# Patient Record
Sex: Male | Born: 1995
Health system: Southern US, Community
[De-identification: ages and names within clinical notes are randomized; demographics above are authoritative.]

## PROBLEM LIST (undated history)

## (undated) DIAGNOSIS — IMO0001 Reserved for inherently not codable concepts without codable children: Secondary | ICD-10-CM

## (undated) DIAGNOSIS — K219 Gastro-esophageal reflux disease without esophagitis: Secondary | ICD-10-CM

## (undated) DIAGNOSIS — J45909 Unspecified asthma, uncomplicated: Secondary | ICD-10-CM

## (undated) DIAGNOSIS — L209 Atopic dermatitis, unspecified: Secondary | ICD-10-CM

## (undated) HISTORY — DX: Reserved for inherently not codable concepts without codable children: IMO0001

## (undated) HISTORY — DX: Atopic dermatitis, unspecified: L20.9

## (undated) HISTORY — PX: WISDOM TOOTH EXTRACTION: SHX21

## (undated) HISTORY — DX: Gastro-esophageal reflux disease without esophagitis: K21.9

## (undated) HISTORY — DX: Unspecified asthma, uncomplicated: J45.909

---
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
## 2016-01-19 ENCOUNTER — Encounter: Payer: Self-pay | Admitting: Allergy and Immunology

## 2016-01-19 ENCOUNTER — Ambulatory Visit (INDEPENDENT_AMBULATORY_CARE_PROVIDER_SITE_OTHER): Payer: BLUE CROSS/BLUE SHIELD | Admitting: Allergy and Immunology

## 2016-01-19 VITALS — BP 116/76 | HR 80 | Resp 18 | Ht 65.0 in | Wt 141.0 lb

## 2016-01-19 DIAGNOSIS — Z8709 Personal history of other diseases of the respiratory system: Secondary | ICD-10-CM

## 2016-01-19 MED ORDER — ALBUTEROL SULFATE HFA 108 (90 BASE) MCG/ACT IN AERS: 2.0000 | INHALATION_SPRAY | Freq: Four times a day (QID) | RESPIRATORY_TRACT | 1 refills | Status: DC | PRN

## 2016-01-19 MED ORDER — MOMETASONE FUROATE 110 MCG/INH IN AEPB: 1.0000 | INHALATION_SPRAY | Freq: Every day | RESPIRATORY_TRACT | 1 refills | Status: DC

## 2016-01-19 NOTE — Progress Notes (Signed)
Follow-up Note  Referring Provider: No ref. provider found Primary Provider: Guadalupe Maple, MD Date of Office Visit: 01/19/2016  Subjective:   Bobby Kim (DOB: Aug 23, 1995) is a 20 y.o. male who [facility] in re-evaluation of the following:  HPI: Rontrell presents this clinic in evaluation of his history of asthma and allergic rhinitis and atopic dermatitis. I've not seen him in his clinic since December 2014  He has resolved all of his atopic symptoms. He has no problems with his chest or nose or skin. He does not use any controller agents. He does not use any rescue medications. He can exercise without any difficulty. It is been over a year since she's used a short-acting bronchodilator    Medication List    none      Past Medical History:  Diagnosis Date  . Asthma   . Atopic dermatitis   . Reflux     Past Surgical History:  Procedure Laterality Date  . WISDOM TOOTH EXTRACTION      No Known Allergies  Review of systems negative except as noted in HPI / PMHx or noted below:  Review of Systems  Constitutional: Negative.   HENT: Negative.   Eyes: Negative.   Respiratory: Negative.   Cardiovascular: Negative.   Gastrointestinal: Negative.   Genitourinary: Negative.   Musculoskeletal: Negative.   Skin: Negative.   Neurological: Negative.   Endo/Heme/Allergies: Negative.   Psychiatric/Behavioral: Negative.      Objective:   Vitals:   01/19/16 1609  BP: 116/76  Pulse: 80  Resp: 18   Height:  (165.1 cm)  Weight: 141 lb (64 kg)   Physical Exam  Constitutional: He is well-developed, well-nourished, and in no distress.  HENT:  Head: Normocephalic.  Right Ear: Tympanic membrane, external ear and ear canal normal.  Left Ear: Tympanic membrane, external ear and ear canal normal.  Nose: Nose normal. No mucosal edema or rhinorrhea.  Mouth/Throat: Uvula is midline, oropharynx is clear and moist  and mucous membranes are normal. No oropharyngeal exudate.  Eyes: Conjunctivae are normal.  Neck: Trachea normal. No tracheal tenderness present. No tracheal deviation present. No thyromegaly present.  Cardiovascular: Normal rate, regular rhythm, S1 normal, S2 normal and normal heart sounds.   No murmur heard. Pulmonary/Chest: Breath sounds normal. No stridor. No respiratory distress. He has no wheezes. He has no rales.  Musculoskeletal: He exhibits no edema.  Lymphadenopathy:       Head (right side): No tonsillar adenopathy present.       Head (left side): No tonsillar adenopathy present.    He has no cervical adenopathy.  Neurological: He is alert. Gait normal.  Skin: No rash noted. He is not diaphoretic. No erythema. Nails show no clubbing.  Psychiatric: Mood and affect normal.    Diagnostics:    Spirometry was performed and demonstrated an FEV1 of 4.04 at 103 % of predicted.  The patient had an Asthma Control Test with the following results:  .    Assessment and Plan:   1. History of asthma     1. Return to clinic if needed  2. Obtain fall flu vaccine  Tracy is doing wonderful. He does appear to be a true remitter regarding his atopic disease and I don't think that he will require any medications at this point in time including the use of her rescue inhaler. In fact, he told me today that he will not pick up a  rescue inhaler. I will certainly be available for him should he develop a significant problem in the future but otherwise he can return to this clinic as needed.  Laurette SchimkeEric Regie Bunner, MD [facility]

## 2016-01-19 NOTE — Patient Instructions (Addendum)
  1. Return to clinic if needed  2. Obtain fall flu vaccine 

## 2016-01-30 ENCOUNTER — Ambulatory Visit: Payer: Self-pay | Admitting: Allergy and Immunology

## 2019-06-02 ENCOUNTER — Ambulatory Visit: Payer: BC Managed Care – PPO | Admitting: Podiatry

## 2019-08-24 ENCOUNTER — Other Ambulatory Visit: Payer: Self-pay

## 2019-08-24 ENCOUNTER — Ambulatory Visit (INDEPENDENT_AMBULATORY_CARE_PROVIDER_SITE_OTHER): Payer: BC Managed Care – PPO | Admitting: Podiatry

## 2019-08-24 DIAGNOSIS — L600 Ingrowing nail: Secondary | ICD-10-CM

## 2019-08-24 DIAGNOSIS — M79676 Pain in unspecified toe(s): Secondary | ICD-10-CM

## 2019-08-24 MED ORDER — NEOMYCIN-POLYMYXIN-HC 3.5-10000-1 OT SOLN: OTIC | 0 refills | Status: DC

## 2019-08-24 MED ORDER — NEOMYCIN-POLYMYXIN-HC 3.5-10000-1 OT SOLN: OTIC | 0 refills | Status: AC

## 2019-08-24 NOTE — Progress Notes (Signed)
  Subjective:  Patient ID: Bobby Kim, male    DOB: 1995/09/25,  MRN: [id number]  Chief Complaint  Patient presents with  . Nail Problem    Rt hallux laterla border x 1 wk; 6/10 tender ot touch -pt deneis injury/swelling/drainage -w/ sligth redness Tx:" none     24 y.o. male presents with the above complaint. History confirmed with patient.   Objective:  Physical Exam: warm, good capillary refill, no trophic changes or ulcerative lesions, normal DP and PT pulses and normal sensory exam.  Painful ingrowing nail at lateral border of the right, hallux; without warmth, erythema or drainage  Assessment:   1. Ingrown nail   2. Pain around toenail      Plan:  Patient was evaluated and treated and all questions answered.  Ingrown Nail, right -Patient elects to proceed with ingrown toenail removal today -Ingrown nail excised. See procedure note. -Educated on post-procedure care including soaking. Written instructions provided. -Rx for Cortisporin drops -Patient to follow up in 2 weeks for nail check.  Procedure: Excision of Ingrown Toenail Location: Right 1st toe lateral nail borders. Anesthesia: Lidocaine 1% plain; 1.61mL and Marcaine 0.5% plain; 1.75mL, digital block. Skin Prep: Betadine. Dressing: Silvadene; telfa; dry, sterile, compression dressing. Technique: Following skin prep, the toe was exsanguinated and a tourniquet was secured at the base of the toe. The affected nail border was freed, split with a nail splitter, and excised. Chemical matrixectomy was then performed with phenol and irrigated out with alcohol. The tourniquet was then removed and sterile dressing applied. Disposition: Patient tolerated procedure well. Patient to return in 2 weeks for follow-up.  Return in about 2 weeks (around 09/07/2019) for Nail Check.

## 2019-08-24 NOTE — Addendum Note (Signed)
Addended by: Dinah Beers on: 08/24/2019 11:55 AM   Modules accepted: Orders

## 2019-08-24 NOTE — Patient Instructions (Signed)

## 2019-09-07 ENCOUNTER — Ambulatory Visit (INDEPENDENT_AMBULATORY_CARE_PROVIDER_SITE_OTHER): Payer: BC Managed Care – PPO | Admitting: Podiatry

## 2019-09-07 DIAGNOSIS — Z5329 Procedure and treatment not carried out because of patient's decision for other reasons: Secondary | ICD-10-CM

## 2019-09-07 NOTE — Progress Notes (Signed)
No show for appt.
